# Patient Record
Sex: Female | Born: 1980 | Race: White | Hispanic: No | Marital: Single | Smoking: Current every day smoker
Health system: Southern US, Community
[De-identification: ages and names within clinical notes are randomized; demographics above are authoritative.]

## PROBLEM LIST (undated history)

## (undated) DIAGNOSIS — Z789 Other specified health status: Secondary | ICD-10-CM

## (undated) DIAGNOSIS — B999 Unspecified infectious disease: Secondary | ICD-10-CM

## (undated) DIAGNOSIS — B009 Herpesviral infection, unspecified: Secondary | ICD-10-CM

## (undated) HISTORY — PX: NO PAST SURGERIES: SHX2092

---

## 2010-05-24 NOTE — L&D Delivery Note (Signed)
Delivery Note At 8:56 PM a viable female was delivered via Vaginal, Spontaneous Delivery (Presentation: Vtx, OA ).  Placenta status: delivered spontaneously, intact w/cord traction .  Cord:  3VC .    Anesthesia: Epidural  Episiotomy: None Lacerations: None  Est. Blood Loss (mL): 300  Mom to postpartum.  Baby to nursery-stable.  JACKSON-MOORE,Lynea Rollison A 01/10/2011, 9:08 PM

## 2010-06-30 ENCOUNTER — Emergency Department (HOSPITAL_COMMUNITY)
Admission: EM | Admit: 2010-06-30 | Discharge: 2010-06-30 | Disposition: A | Discharge status: Home / Self Care | Payer: Medicaid Other | Attending: Emergency Medicine | Admitting: Emergency Medicine

## 2010-06-30 ENCOUNTER — Emergency Department (HOSPITAL_COMMUNITY): Payer: Medicaid Other

## 2010-06-30 ENCOUNTER — Emergency Department (HOSPITAL_COMMUNITY): Payer: Self-pay

## 2010-06-30 DIAGNOSIS — O210 Mild hyperemesis gravidarum: Secondary | ICD-10-CM | POA: Insufficient documentation

## 2010-06-30 LAB — POCT I-STAT, CHEM 8
BUN: 5 mg/dL — ABNORMAL LOW (ref 6–23)
Calcium, Ion: 1.16 mmol/L (ref 1.12–1.32)
Chloride: 103 mEq/L (ref 96–112)
Creatinine, Ser: 0.8 mg/dL (ref 0.4–1.2)
Glucose, Bld: 80 mg/dL (ref 70–99)
HCT: 41 % (ref 36.0–46.0)
Hemoglobin: 13.9 g/dL (ref 12.0–15.0)
Potassium: 3.9 meq/L (ref 3.5–5.1)
Sodium: 137 meq/L (ref 135–145)
TCO2: 21 mmol/L (ref 0–100)

## 2010-06-30 LAB — URINE MICROSCOPIC-ADD ON

## 2010-06-30 LAB — POCT PREGNANCY, URINE: Preg Test, Ur: POSITIVE

## 2010-06-30 LAB — URINALYSIS, ROUTINE W REFLEX MICROSCOPIC
Bilirubin Urine: NEGATIVE
Hgb urine dipstick: NEGATIVE
Ketones, ur: NEGATIVE mg/dL
Nitrite: NEGATIVE
Protein, ur: NEGATIVE mg/dL
Specific Gravity, Urine: 1.025 (ref 1.005–1.030)
Urine Glucose, Fasting: NEGATIVE mg/dL
Urobilinogen, UA: 1 mg/dL (ref 0.0–1.0)
pH: 6 (ref 5.0–8.0)

## 2010-08-10 ENCOUNTER — Inpatient Hospital Stay (HOSPITAL_COMMUNITY)
Admission: AD | Admit: 2010-08-10 | Discharge: 2010-08-10 | Disposition: A | Discharge status: Home / Self Care | Payer: Medicaid Other | Source: Ambulatory Visit | Attending: Obstetrics and Gynecology | Admitting: Obstetrics and Gynecology

## 2010-08-10 DIAGNOSIS — N12 Tubulo-interstitial nephritis, not specified as acute or chronic: Secondary | ICD-10-CM | POA: Insufficient documentation

## 2010-08-10 DIAGNOSIS — O2390 Unspecified genitourinary tract infection in pregnancy, unspecified trimester: Secondary | ICD-10-CM

## 2010-08-10 DIAGNOSIS — M549 Dorsalgia, unspecified: Secondary | ICD-10-CM | POA: Insufficient documentation

## 2010-08-10 LAB — URINE MICROSCOPIC-ADD ON

## 2010-08-10 LAB — CBC
Platelets: 241 10*3/uL (ref 150–400)
RBC: 4.36 MIL/uL (ref 3.87–5.11)
WBC: 13.3 10*3/uL — ABNORMAL HIGH (ref 4.0–10.5)

## 2010-08-10 LAB — URINALYSIS, ROUTINE W REFLEX MICROSCOPIC
Bilirubin Urine: NEGATIVE
Glucose, UA: NEGATIVE mg/dL
Specific Gravity, Urine: 1.02 (ref 1.005–1.030)
pH: 6 (ref 5.0–8.0)

## 2010-08-12 LAB — URINE CULTURE: Culture  Setup Time: 201203200117

## 2010-09-17 ENCOUNTER — Other Ambulatory Visit (HOSPITAL_COMMUNITY): Payer: Self-pay | Admitting: Obstetrics

## 2010-09-17 DIAGNOSIS — O2690 Pregnancy related conditions, unspecified, unspecified trimester: Secondary | ICD-10-CM

## 2010-09-17 DIAGNOSIS — Z0489 Encounter for examination and observation for other specified reasons: Secondary | ICD-10-CM

## 2010-09-22 ENCOUNTER — Ambulatory Visit (HOSPITAL_COMMUNITY)
Admission: RE | Admit: 2010-09-22 | Discharge: 2010-09-22 | Disposition: A | Discharge status: Home / Self Care | Payer: Medicaid Other | Source: Ambulatory Visit | Attending: Obstetrics | Admitting: Obstetrics

## 2010-09-22 DIAGNOSIS — O352XX0 Maternal care for (suspected) hereditary disease in fetus, not applicable or unspecified: Secondary | ICD-10-CM | POA: Insufficient documentation

## 2010-09-22 DIAGNOSIS — Z363 Encounter for antenatal screening for malformations: Secondary | ICD-10-CM | POA: Insufficient documentation

## 2010-09-22 DIAGNOSIS — O358XX0 Maternal care for other (suspected) fetal abnormality and damage, not applicable or unspecified: Secondary | ICD-10-CM | POA: Insufficient documentation

## 2010-09-22 DIAGNOSIS — O2690 Pregnancy related conditions, unspecified, unspecified trimester: Secondary | ICD-10-CM

## 2010-09-22 DIAGNOSIS — O99330 Smoking (tobacco) complicating pregnancy, unspecified trimester: Secondary | ICD-10-CM | POA: Insufficient documentation

## 2010-09-22 DIAGNOSIS — Z0489 Encounter for examination and observation for other specified reasons: Secondary | ICD-10-CM

## 2010-09-22 DIAGNOSIS — Z1389 Encounter for screening for other disorder: Secondary | ICD-10-CM | POA: Insufficient documentation

## 2010-10-05 ENCOUNTER — Other Ambulatory Visit (HOSPITAL_COMMUNITY): Payer: Self-pay | Admitting: Obstetrics

## 2010-10-05 DIAGNOSIS — O99340 Other mental disorders complicating pregnancy, unspecified trimester: Secondary | ICD-10-CM

## 2010-10-05 DIAGNOSIS — O352XX0 Maternal care for (suspected) hereditary disease in fetus, not applicable or unspecified: Secondary | ICD-10-CM

## 2010-10-09 LAB — GC/CHLAMYDIA PROBE AMP, GENITAL: Gonorrhea: NEGATIVE

## 2010-10-09 LAB — ABO/RH: RH Type: POSITIVE

## 2010-10-09 LAB — RUBELLA ANTIBODY, IGM: Rubella: IMMUNE

## 2010-11-16 ENCOUNTER — Inpatient Hospital Stay (HOSPITAL_COMMUNITY)
Admission: AD | Admit: 2010-11-16 | Discharge: 2010-11-16 | Disposition: A | Discharge status: Home / Self Care | Payer: Medicaid Other | Source: Ambulatory Visit | Attending: Obstetrics | Admitting: Obstetrics

## 2010-11-16 DIAGNOSIS — K219 Gastro-esophageal reflux disease without esophagitis: Secondary | ICD-10-CM

## 2010-11-16 DIAGNOSIS — R109 Unspecified abdominal pain: Secondary | ICD-10-CM | POA: Insufficient documentation

## 2010-11-16 DIAGNOSIS — O9989 Other specified diseases and conditions complicating pregnancy, childbirth and the puerperium: Secondary | ICD-10-CM

## 2010-11-16 DIAGNOSIS — O2390 Unspecified genitourinary tract infection in pregnancy, unspecified trimester: Secondary | ICD-10-CM | POA: Insufficient documentation

## 2010-11-16 DIAGNOSIS — O99891 Other specified diseases and conditions complicating pregnancy: Secondary | ICD-10-CM

## 2010-11-16 DIAGNOSIS — N390 Urinary tract infection, site not specified: Secondary | ICD-10-CM | POA: Insufficient documentation

## 2010-11-16 LAB — URINALYSIS, ROUTINE W REFLEX MICROSCOPIC
Bilirubin Urine: NEGATIVE
Glucose, UA: NEGATIVE mg/dL
Ketones, ur: NEGATIVE mg/dL
Specific Gravity, Urine: 1.015 (ref 1.005–1.030)
pH: 7.5 (ref 5.0–8.0)

## 2010-11-16 LAB — URINE MICROSCOPIC-ADD ON

## 2010-11-17 ENCOUNTER — Encounter (HOSPITAL_COMMUNITY): Payer: Self-pay

## 2010-11-17 ENCOUNTER — Ambulatory Visit (HOSPITAL_COMMUNITY)
Admission: RE | Admit: 2010-11-17 | Discharge: 2010-11-17 | Disposition: A | Discharge status: Home / Self Care | Payer: Medicaid Other | Source: Ambulatory Visit | Attending: Obstetrics | Admitting: Obstetrics

## 2010-11-17 DIAGNOSIS — O352XX0 Maternal care for (suspected) hereditary disease in fetus, not applicable or unspecified: Secondary | ICD-10-CM | POA: Insufficient documentation

## 2010-11-17 DIAGNOSIS — O99340 Other mental disorders complicating pregnancy, unspecified trimester: Secondary | ICD-10-CM

## 2010-11-17 DIAGNOSIS — O99330 Smoking (tobacco) complicating pregnancy, unspecified trimester: Secondary | ICD-10-CM | POA: Insufficient documentation

## 2010-11-20 LAB — URINE CULTURE

## 2010-12-18 ENCOUNTER — Encounter (HOSPITAL_COMMUNITY): Payer: Self-pay | Admitting: *Deleted

## 2010-12-18 ENCOUNTER — Inpatient Hospital Stay (HOSPITAL_COMMUNITY)
Admission: AD | Admit: 2010-12-18 | Discharge: 2010-12-18 | Disposition: A | Discharge status: Home / Self Care | Payer: Medicaid Other | Source: Ambulatory Visit | Attending: Obstetrics | Admitting: Obstetrics

## 2010-12-18 DIAGNOSIS — O99330 Smoking (tobacco) complicating pregnancy, unspecified trimester: Secondary | ICD-10-CM | POA: Insufficient documentation

## 2010-12-18 DIAGNOSIS — O9989 Other specified diseases and conditions complicating pregnancy, childbirth and the puerperium: Secondary | ICD-10-CM | POA: Insufficient documentation

## 2010-12-18 DIAGNOSIS — R109 Unspecified abdominal pain: Secondary | ICD-10-CM | POA: Insufficient documentation

## 2010-12-18 DIAGNOSIS — O26899 Other specified pregnancy related conditions, unspecified trimester: Secondary | ICD-10-CM

## 2010-12-18 DIAGNOSIS — N949 Unspecified condition associated with female genital organs and menstrual cycle: Secondary | ICD-10-CM | POA: Insufficient documentation

## 2010-12-18 HISTORY — DX: Herpesviral infection, unspecified: B00.9

## 2010-12-18 LAB — URINALYSIS, ROUTINE W REFLEX MICROSCOPIC
Bilirubin Urine: NEGATIVE
Nitrite: NEGATIVE
Specific Gravity, Urine: 1.01 (ref 1.005–1.030)
Urobilinogen, UA: 0.2 mg/dL (ref 0.0–1.0)
pH: 7 (ref 5.0–8.0)

## 2010-12-18 LAB — URINE MICROSCOPIC-ADD ON

## 2010-12-18 MED ORDER — TERBUTALINE SULFATE 1 MG/ML IJ SOLN
0.2500 mg | Freq: Once | INTRAMUSCULAR | Status: AC
  Administered 2010-12-18: 0.25 mg via SUBCUTANEOUS
  Filled 2010-12-18: qty 1

## 2010-12-18 NOTE — ED Provider Notes (Signed)
History   Pt presents today c/o vaginal pain since yesterday. She is 34.3wks. Her last episode of intercourse was yesterday. She denies vag bleeding, dc, fever, or any other problems at this time.  Chief Complaint  Patient presents with  . Pelvic Pain   HPI  OB History    Grav Para Term Preterm Abortions TAB SAB Ect Mult Living   6 5 1 4  0 0 0 0 0 4      Past Medical History  Diagnosis Date  . Herpes     History reviewed. No pertinent past surgical history.  Family History  Problem Relation Age of Onset  . Diabetes Mother   . Asthma Mother     History  Substance Use Topics  . Smoking status: Current Everyday Smoker -- 0.5 packs/day for 9 years    Types: Cigarettes  . Smokeless tobacco: Never Used  . Alcohol Use: No    Allergies: No Known Allergies  Prescriptions prior to admission  Medication Sig Dispense Refill  . calcium carbonate (TUMS - DOSED IN MG ELEMENTAL CALCIUM) 500 MG chewable tablet Chew 2 tablets by mouth once.        . prenatal vitamin w/FE, FA (PRENATAL 1 + 1) 27-1 MG TABS Take 1 tablet by mouth daily.          Review of Systems  Constitutional: Negative for fever.  Cardiovascular: Negative for chest pain and palpitations.  Gastrointestinal: Positive for abdominal pain. Negative for nausea, vomiting, diarrhea and constipation.  Genitourinary: Negative for dysuria, urgency, frequency, hematuria and flank pain.  Neurological: Negative for dizziness and headaches.  Psychiatric/Behavioral: Negative for depression and suicidal ideas.   Physical Exam   Blood pressure 110/62, pulse 88, temperature 98.3 F (36.8 C), temperature source Oral, resp. rate 20, height 5\' 5"  (1.651 m), weight 154 lb (69.854 kg).  Physical Exam  Constitutional: She is oriented to person, place, and time. She appears well-developed and well-nourished. No distress.  HENT:  Head: Normocephalic and atraumatic.  Eyes: EOM are normal. Pupils are equal, round, and reactive to  light.  GI: Soft. She exhibits no distension. There is no tenderness. There is no rebound and no guarding.  Genitourinary: No tenderness or bleeding around the vagina. No vaginal discharge found.       Cervix FT/thick/high.  Neurological: She is alert and oriented to person, place, and time.  Skin: Skin is warm and dry. She is not diaphoretic.  Psychiatric: She has a normal mood and affect. Her behavior is normal. Judgment and thought content normal.    MAU Course  Procedures  NST reactive. Ctx every 2-4 min that have subsided since pt given terbutaline. Cervix remained unchanged over an hour.  Assessment and Plan  Preg: discussed signs and sx of preterm labor. Reminded of [facility]. She has a f/u appt scheduled with Dr. Gaynell Face on Monday. Discussed diet, activity, risks, and precautions.  Clinton Gallant. Tadeusz Stahl III, DrHSc, MPAS, PA-C  12/18/2010, 11:09 AM   Henrietta Hoover, PA 12/18/10 1115

## 2010-12-18 NOTE — Progress Notes (Signed)
Pain in crotch all night, unable to sleep

## 2010-12-23 LAB — STREP B DNA PROBE: GBS: NEGATIVE

## 2011-01-09 ENCOUNTER — Encounter (HOSPITAL_COMMUNITY): Payer: Self-pay

## 2011-01-09 ENCOUNTER — Inpatient Hospital Stay (HOSPITAL_COMMUNITY)
Admission: AD | Admit: 2011-01-09 | Discharge: 2011-01-09 | Disposition: A | Discharge status: Home / Self Care | Payer: Medicaid Other | Source: Ambulatory Visit | Attending: Obstetrics | Admitting: Obstetrics

## 2011-01-09 DIAGNOSIS — O479 False labor, unspecified: Secondary | ICD-10-CM | POA: Insufficient documentation

## 2011-01-09 NOTE — Progress Notes (Signed)
Contractions since last night patient appears very uncomfortable brought directly to room 4 in MAU

## 2011-01-09 NOTE — ED Notes (Signed)
Called Dr. Tamela Oddi on call for Dr. Gaynell Face, patient is G6P5 (1 preterm delivery 32 weeks baby died), contractions every 2 to 3 minutes, cervix 2/80/-1 bloody show, fhr reassuring, GBS negative, patient uncomfortable, received order to recheck cervix in one hour.

## 2011-01-10 ENCOUNTER — Inpatient Hospital Stay (HOSPITAL_COMMUNITY)
Admission: AD | Admit: 2011-01-10 | Discharge: 2011-01-12 | DRG: 775 | Disposition: A | Discharge status: Home / Self Care | Payer: Medicaid Other | Source: Ambulatory Visit | Attending: Obstetrics | Admitting: Obstetrics

## 2011-01-10 ENCOUNTER — Inpatient Hospital Stay (HOSPITAL_COMMUNITY)
Admission: AD | Admit: 2011-01-10 | Discharge: 2011-01-10 | Disposition: A | Discharge status: Home / Self Care | Payer: Medicaid Other | Source: Ambulatory Visit | Attending: Obstetrics | Admitting: Obstetrics

## 2011-01-10 ENCOUNTER — Encounter (HOSPITAL_COMMUNITY): Payer: Self-pay | Admitting: Anesthesiology

## 2011-01-10 ENCOUNTER — Encounter (HOSPITAL_COMMUNITY): Payer: Self-pay | Admitting: *Deleted

## 2011-01-10 ENCOUNTER — Encounter (HOSPITAL_COMMUNITY): Payer: Self-pay | Admitting: Obstetrics and Gynecology

## 2011-01-10 ENCOUNTER — Inpatient Hospital Stay (HOSPITAL_COMMUNITY): Payer: Medicaid Other | Admitting: Anesthesiology

## 2011-01-10 DIAGNOSIS — B999 Unspecified infectious disease: Secondary | ICD-10-CM | POA: Insufficient documentation

## 2011-01-10 DIAGNOSIS — IMO0001 Reserved for inherently not codable concepts without codable children: Secondary | ICD-10-CM

## 2011-01-10 DIAGNOSIS — O479 False labor, unspecified: Secondary | ICD-10-CM | POA: Insufficient documentation

## 2011-01-10 HISTORY — DX: Unspecified infectious disease: B99.9

## 2011-01-10 LAB — CBC
HCT: 36.6 % (ref 36.0–46.0)
Hemoglobin: 12.5 g/dL (ref 12.0–15.0)
MCHC: 34.2 g/dL (ref 30.0–36.0)
RBC: 4.28 MIL/uL (ref 3.87–5.11)

## 2011-01-10 LAB — RPR: RPR Ser Ql: NONREACTIVE

## 2011-01-10 MED ORDER — PHENYLEPHRINE 40 MCG/ML (10ML) SYRINGE FOR IV PUSH (FOR BLOOD PRESSURE SUPPORT)
PREFILLED_SYRINGE | INTRAVENOUS | Status: AC
  Filled 2011-01-10: qty 5

## 2011-01-10 MED ORDER — OXYTOCIN 20 UNITS IN LACTATED RINGERS INFUSION - SIMPLE: 125.0000 mL/h | INTRAVENOUS | Status: DC

## 2011-01-10 MED ORDER — PHENYLEPHRINE 40 MCG/ML (10ML) SYRINGE FOR IV PUSH (FOR BLOOD PRESSURE SUPPORT): 80.0000 ug | PREFILLED_SYRINGE | INTRAVENOUS | Status: DC | PRN

## 2011-01-10 MED ORDER — LIDOCAINE HCL (PF) 1 % IJ SOLN: 30.0000 mL | INTRAMUSCULAR | Status: DC | PRN

## 2011-01-10 MED ORDER — LACTATED RINGERS IV SOLN
INTRAVENOUS | Status: DC
  Administered 2011-01-10: 17:00:00 via INTRAVENOUS

## 2011-01-10 MED ORDER — LIDOCAINE HCL 1.5 % IJ SOLN
INTRAMUSCULAR | Status: DC | PRN
  Administered 2011-01-10 (×2): 5 mL via EPIDURAL

## 2011-01-10 MED ORDER — CITRIC ACID-SODIUM CITRATE 334-500 MG/5ML PO SOLN: 30.0000 mL | ORAL | Status: DC | PRN

## 2011-01-10 MED ORDER — EPHEDRINE 5 MG/ML INJ
INTRAVENOUS | Status: AC
  Filled 2011-01-10: qty 4

## 2011-01-10 MED ORDER — MISOPROSTOL 25 MCG QUARTER TABLET: 75.0000 ug | ORAL_TABLET | ORAL | Status: DC

## 2011-01-10 MED ORDER — EPHEDRINE 5 MG/ML INJ: 10.0000 mg | INTRAVENOUS | Status: DC | PRN

## 2011-01-10 MED ORDER — LACTATED RINGERS IV SOLN: 500.0000 mL | INTRAVENOUS | Status: DC | PRN

## 2011-01-10 MED ORDER — OXYTOCIN BOLUS FROM INFUSION
500.0000 mL | Freq: Once | INTRAVENOUS | Status: DC
  Filled 2011-01-10: qty 500

## 2011-01-10 MED ORDER — DIPHENHYDRAMINE HCL 50 MG/ML IJ SOLN: 12.5000 mg | INTRAMUSCULAR | Status: DC | PRN

## 2011-01-10 MED ORDER — FENTANYL 2.5 MCG/ML BUPIVACAINE 1/10 % EPIDURAL INFUSION (WH - ANES)
INTRAMUSCULAR | Status: AC
  Filled 2011-01-10: qty 60

## 2011-01-10 MED ORDER — FENTANYL 2.5 MCG/ML BUPIVACAINE 1/10 % EPIDURAL INFUSION (WH - ANES)
INTRAMUSCULAR | Status: DC | PRN
  Administered 2011-01-10: 14 mL/h via EPIDURAL

## 2011-01-10 MED ORDER — ZOLPIDEM TARTRATE 10 MG PO TABS
10.0000 mg | ORAL_TABLET | Freq: Once | ORAL | Status: AC
  Administered 2011-01-10: 10 mg via ORAL
  Filled 2011-01-10: qty 1

## 2011-01-10 MED ORDER — LACTATED RINGERS IV SOLN: 500.0000 mL | Freq: Once | INTRAVENOUS | Status: DC

## 2011-01-10 MED ORDER — FLEET ENEMA 7-19 GM/118ML RE ENEM: 1.0000 | ENEMA | RECTAL | Status: DC | PRN

## 2011-01-10 MED ORDER — FENTANYL 2.5 MCG/ML BUPIVACAINE 1/10 % EPIDURAL INFUSION (WH - ANES): 14.0000 mL/h | INTRAMUSCULAR | Status: DC

## 2011-01-10 MED ORDER — OXYTOCIN 20 UNITS IN LACTATED RINGERS INFUSION - SIMPLE
1.0000 m[IU]/min | INTRAVENOUS | Status: DC
  Filled 2011-01-10: qty 1000

## 2011-01-10 MED ORDER — OXYTOCIN 20 UNITS IN LACTATED RINGERS INFUSION - SIMPLE
125.0000 mL/h | INTRAVENOUS | Status: DC | PRN
  Administered 2011-01-10: 999 mL/h via INTRAVENOUS

## 2011-01-10 MED ORDER — OXYCODONE-ACETAMINOPHEN 5-325 MG PO TABS: 1.0000 | ORAL_TABLET | ORAL | Status: DC | PRN

## 2011-01-10 MED ORDER — TERBUTALINE SULFATE 1 MG/ML IJ SOLN: 0.2500 mg | Freq: Once | INTRAMUSCULAR | Status: AC | PRN

## 2011-01-10 NOTE — Progress Notes (Signed)
Pt brought to MAU room 10 for eval

## 2011-01-10 NOTE — Progress Notes (Signed)
"  I was seen here this morning and they sent me home.  I have been hurting all day, but I tried to ride it out.  About 0000 this morning, the UC's got even worse and closer together, so I had to come back."

## 2011-01-10 NOTE — Anesthesia Preprocedure Evaluation (Signed)
Anesthesia Evaluation  Name, MR# and DOB Patient awake  General Assessment Comment  Reviewed: Allergy & Precautions, H&P , NPO status , Patient's Chart, lab work & pertinent test results  Airway Mallampati: II TM Distance: >3 FB Neck ROM: full    Dental No notable dental hx. (+) Teeth Intact   Pulmonary  clear to auscultation  pulmonary exam normalPulmonary Exam Normal breath sounds clear to auscultation none    Cardiovascular     Neuro/Psych Negative Neurological ROS  Negative Psych ROS  GI/Hepatic/Renal negative GI ROS, negative Liver ROS, and negative Renal ROS (+)       Endo/Other  Negative Endocrine ROS (+)      Abdominal Normal abdominal exam  (+)   Musculoskeletal negative musculoskeletal ROS (+)   Hematology negative hematology ROS (+)   Peds  Reproductive/Obstetrics (+) Pregnancy    Anesthesia Other Findings             Anesthesia Physical Anesthesia Plan  ASA: II  Anesthesia Plan: Epidural   Post-op Pain Management:    Induction:   Airway Management Planned:   Additional Equipment:   Intra-op Plan:   Post-operative Plan:   Informed Consent: I have reviewed the patients History and Physical, chart, labs and discussed the procedure including the risks, benefits and alternatives for the proposed anesthesia with the patient or authorized representative who has indicated his/her understanding and acceptance.     Plan Discussed with:   Anesthesia Plan Comments:         Anesthesia Quick Evaluation  

## 2011-01-10 NOTE — H&P (Signed)
Diana Walter is a 30 y.o. female presenting for contractions. Maternal Medical History:  Reason for admission: Reason for admission: contractions.  Reason for Admission:   nauseaContractions: Onset was 6-12 hours ago.   Frequency: regular.   Perceived severity is strong.    Fetal activity: Perceived fetal activity is normal.   Last perceived fetal movement was within the past hour.    Prenatal complications: Substance abuse.   Tobacco use    OB History    Grav Para Term Preterm Abortions TAB SAB Ect Mult Living   6 5 1 4  0 0 0 0 0 4     Past Medical History  Diagnosis Date  . Herpes   . Infection     Hx of HSV   History reviewed. No pertinent past surgical history. Family History: family history includes Asthma in her mother and Diabetes in her mother. Social History:  reports that she has been smoking Cigarettes.  She has a 4.5 pack-year smoking history. She has never used smokeless tobacco. She reports that she does not drink alcohol or use illicit drugs.  Review of Systems  Constitutional: Negative for fever.  Eyes: Negative for blurred vision.  Respiratory: Negative for shortness of breath.   Gastrointestinal: Negative for nausea and vomiting.  Skin: Negative for rash.  Neurological: Negative for headaches.    Dilation: 4.5 Effacement (%): 100 Station: -2 Exam by:: jackson-moore Blood pressure 101/63, pulse 148, temperature 97.9 F (36.6 C), temperature source Oral, resp. rate 18. Maternal Exam:  Uterine Assessment: Contraction frequency is regular.   Abdomen: Patient reports no abdominal tenderness. Fetal presentation: vertex  Introitus: not evaluated.     Fetal Exam Fetal Monitor Review: Variability: moderate (6-25 bpm).   Pattern: accelerations present and no decelerations.    Fetal State Assessment: Category I - tracings are normal.     Physical Exam  Constitutional: She appears well-developed.  HENT:  Head: Normocephalic.  Neck: Neck supple.  No thyromegaly present.  Cardiovascular: Normal rate and regular rhythm.   Respiratory: Breath sounds normal.  GI: Soft. Bowel sounds are normal.  Skin: No rash noted.    Prenatal labs: ABO, Rh: A/Positive/-- (05/18 0000) Antibody: Negative (05/18 0000) Rubella:   RPR: Nonreactive (05/18 0000)  HBsAg: Negative (05/18 0000)  HIV: Non-reactive (05/18 0000)  GBS: Negative (08/01 0000)   Assessment/Plan: 30 y.o. with an IUP @ [redacted]w[redacted]d.  Latent labor.  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  Admit Epidural Possible augmentation of labor   Anticipate an NSVD JACKSON-MOORE,Rizwan Kuyper A 01/10/2011, 7:26 PM

## 2011-01-10 NOTE — Anesthesia Procedure Notes (Signed)
Epidural Patient location during procedure: OB Start time: 01/10/2011 4:57 PM End time: 01/10/2011 5:06 PM Reason for block: procedure for pain  Staffing Anesthesiologist: Sandrea Hughs Performed by: anesthesiologist   Preanesthetic Checklist Completed: patient identified, site marked, surgical consent, pre-op evaluation, timeout performed, IV checked, risks and benefits discussed and monitors and equipment checked  Epidural Patient position: sitting Prep: site prepped and draped and DuraPrep Patient monitoring: continuous pulse ox and blood pressure Approach: midline Injection technique: LOR air  Needle:  Needle type: Tuohy  Needle gauge: 17 G Needle length: 9 cm Needle insertion depth: 5 cm cm Catheter type: closed end flexible Catheter size: 19 Gauge Catheter at skin depth: 10 cm Test dose: negative and 1.5% lidocaine  Assessment Sensory level: T8 Events: blood not aspirated, injection not painful, no injection resistance, negative IV test and no paresthesia

## 2011-01-10 NOTE — Progress Notes (Signed)
Pt very uncomfortable taken from car in w/c to room. Placed pt on monitor. SVE

## 2011-01-10 NOTE — Progress Notes (Signed)
01/10/11 2000  Provider Notification  Provider Name/Title Roseanna Rainbow, MD  Method of Notification Phone  Pharmacy called to question order for cytotec PO. Called MD to verify order. Provider stated she wants pitocin to get med.  Katina Dung nurse in charge does not feel comfortable administering cytotec PO since policy stated to given PO and wants provider to administer med.. Provider stated to change order to pitocin

## 2011-01-11 ENCOUNTER — Encounter (HOSPITAL_COMMUNITY): Payer: Self-pay | Admitting: *Deleted

## 2011-01-11 LAB — CBC
HCT: 33 % — ABNORMAL LOW (ref 36.0–46.0)
Hemoglobin: 11.1 g/dL — ABNORMAL LOW (ref 12.0–15.0)
MCH: 29.1 pg (ref 26.0–34.0)
MCHC: 33.6 g/dL (ref 30.0–36.0)

## 2011-01-11 MED ORDER — MEASLES, MUMPS & RUBELLA VAC ~~LOC~~ INJ
0.5000 mL | INJECTION | Freq: Once | SUBCUTANEOUS | Status: DC
  Filled 2011-01-11: qty 0.5

## 2011-01-11 MED ORDER — PRENATAL PLUS 27-1 MG PO TABS
1.0000 | ORAL_TABLET | Freq: Every day | ORAL | Status: DC
  Administered 2011-01-11: 1 via ORAL
  Filled 2011-01-11: qty 1

## 2011-01-11 MED ORDER — ONDANSETRON HCL 4 MG PO TABS: 4.0000 mg | ORAL_TABLET | ORAL | Status: DC | PRN

## 2011-01-11 MED ORDER — DIBUCAINE 1 % RE OINT: 1.0000 "application " | TOPICAL_OINTMENT | RECTAL | Status: DC | PRN

## 2011-01-11 MED ORDER — SODIUM CHLORIDE 0.9 % IJ SOLN: 3.0000 mL | INTRAMUSCULAR | Status: DC | PRN

## 2011-01-11 MED ORDER — ONDANSETRON HCL 4 MG/2ML IJ SOLN: 4.0000 mg | INTRAMUSCULAR | Status: DC | PRN

## 2011-01-11 MED ORDER — MAGNESIUM HYDROXIDE 400 MG/5ML PO SUSP: 30.0000 mL | ORAL | Status: DC | PRN

## 2011-01-11 MED ORDER — BENZOCAINE-MENTHOL 20-0.5 % EX AERO: 1.0000 "application " | INHALATION_SPRAY | CUTANEOUS | Status: DC | PRN

## 2011-01-11 MED ORDER — SODIUM CHLORIDE 0.9 % IV SOLN: 250.0000 mL | INTRAVENOUS | Status: DC

## 2011-01-11 MED ORDER — MEDROXYPROGESTERONE ACETATE 150 MG/ML IM SUSP: 150.0000 mg | INTRAMUSCULAR | Status: DC | PRN

## 2011-01-11 MED ORDER — TETANUS-DIPHTH-ACELL PERTUSSIS 5-2.5-18.5 LF-MCG/0.5 IM SUSP
0.5000 mL | Freq: Once | INTRAMUSCULAR | Status: AC
  Administered 2011-01-11: 0.5 mL via INTRAMUSCULAR
  Filled 2011-01-11: qty 0.5

## 2011-01-11 MED ORDER — DIPHENHYDRAMINE HCL 25 MG PO CAPS: 25.0000 mg | ORAL_CAPSULE | Freq: Four times a day (QID) | ORAL | Status: DC | PRN

## 2011-01-11 MED ORDER — FERROUS SULFATE 325 (65 FE) MG PO TABS
325.0000 mg | ORAL_TABLET | Freq: Two times a day (BID) | ORAL | Status: DC
  Administered 2011-01-11 (×2): 325 mg via ORAL
  Filled 2011-01-11 (×2): qty 1

## 2011-01-11 MED ORDER — ZOLPIDEM TARTRATE 5 MG PO TABS: 5.0000 mg | ORAL_TABLET | Freq: Every evening | ORAL | Status: DC | PRN

## 2011-01-11 MED ORDER — IBUPROFEN 600 MG PO TABS
600.0000 mg | ORAL_TABLET | Freq: Four times a day (QID) | ORAL | Status: DC
  Administered 2011-01-11 – 2011-01-12 (×7): 600 mg via ORAL
  Filled 2011-01-11 (×7): qty 1

## 2011-01-11 MED ORDER — WITCH HAZEL-GLYCERIN EX PADS: 1.0000 "application " | MEDICATED_PAD | CUTANEOUS | Status: DC | PRN

## 2011-01-11 MED ORDER — LANOLIN HYDROUS EX OINT: TOPICAL_OINTMENT | CUTANEOUS | Status: DC | PRN

## 2011-01-11 MED ORDER — SODIUM CHLORIDE 0.9 % IJ SOLN: 3.0000 mL | Freq: Two times a day (BID) | INTRAMUSCULAR | Status: DC

## 2011-01-11 MED ORDER — SENNOSIDES-DOCUSATE SODIUM 8.6-50 MG PO TABS
2.0000 | ORAL_TABLET | Freq: Every day | ORAL | Status: DC
  Administered 2011-01-11: 2 via ORAL

## 2011-01-11 NOTE — Progress Notes (Signed)
UR chart review completed.  

## 2011-01-11 NOTE — Anesthesia Postprocedure Evaluation (Signed)
  Anesthesia Post-op Note  Patient: Diana Walter  Procedure(s) Performed: * No procedures listed *  Patient Location: Mother/Baby  Anesthesia Type: Epidural  Level of Consciousness: awake, alert  and oriented  Airway and Oxygen Therapy: Patient Spontanous Breathing  Post-op Pain: none  Post-op Assessment: Post-op Vital signs reviewed, Patient's Cardiovascular Status Stable, No headache, No backache, No residual numbness and No residual motor weakness  Post-op Vital Signs: Reviewed and stable  Complications: No apparent anesthesia complications

## 2011-01-11 NOTE — Progress Notes (Signed)
  Postpartum day one Vital signs normal Fundus firm Lochia moderate Legs negative No complaints doing well

## 2011-01-11 NOTE — Anesthesia Postprocedure Evaluation (Signed)
Anesthesia Post Note  Patient: Diana Walter  Procedure(s) Performed: * No procedures listed *  Anesthesia type: Epidural  Patient location: Mother/Baby  Post pain: Pain level controlled  Post assessment: Post-op Vital signs reviewed  Last Vitals:  Filed Vitals:   01/10/11 2231  BP: 117/58  Pulse: 98  Temp:   Resp:     Post vital signs: Reviewed  Level of consciousness: awake  Complications: No apparent anesthesia complications

## 2011-01-11 NOTE — Addendum Note (Signed)
Addendum  created 01/11/11 0933 by Madison Hickman   Modules edited:Charges VN, Notes Section

## 2011-01-12 NOTE — Progress Notes (Signed)
  Postpartum day 2 Vital signs normal Fundus firm Lochia moderate Legs negative No complaints home today

## 2011-01-12 NOTE — Discharge Summary (Signed)
Obstetric Discharge Summary Reason for Admission: onset of labor Prenatal Procedures: none Intrapartum Procedures: spontaneous vaginal delivery Postpartum Procedures: none Complications-Operative and Postpartum: none Hemoglobin  Date Value Range Status  01/11/2011 11.1* 12.0-15.0 (g/dL) Final     HCT  Date Value Range Status  01/11/2011 33.0* 36.0-46.0 (%) Final    Discharge Diagnoses: Term Pregnancy-delivered  Discharge Information: Date: 01/12/2011 Activity: pelvic rest Diet: routine Medications: Tylenol #3 Condition: stable Instructions: refer to practice specific booklet Discharge to: home Follow-up Information    Follow up with [name], MD. Call in 6 weeks.   Contact information:   [address] [place] [address] [phone]          Newborn Data: Live born female  Birth Weight: 6 lb 10.2 oz (3011 g) APGAR: 9, 9  Home with mother.  Diana Walter 01/12/2011, 6:27 AM

## 2011-04-12 ENCOUNTER — Other Ambulatory Visit: Payer: Self-pay | Admitting: Obstetrics

## 2011-04-12 ENCOUNTER — Encounter (HOSPITAL_COMMUNITY)
Admission: RE | Admit: 2011-04-12 | Discharge: 2011-04-12 | Disposition: A | Discharge status: Home / Self Care | Payer: Medicaid Other | Source: Ambulatory Visit | Attending: Obstetrics | Admitting: Obstetrics

## 2011-04-12 ENCOUNTER — Encounter (HOSPITAL_COMMUNITY): Payer: Self-pay

## 2011-04-12 HISTORY — DX: Other specified health status: Z78.9

## 2011-04-12 LAB — CBC
HCT: 43 % (ref 36.0–46.0)
MCH: 29.6 pg (ref 26.0–34.0)
MCV: 90.1 fL (ref 78.0–100.0)
Platelets: 313 10*3/uL (ref 150–400)
RDW: 14.6 % (ref 11.5–15.5)

## 2011-04-12 NOTE — Patient Instructions (Addendum)
YOUR PROCEDURE IS SCHEDULED ON:04/22/11  ENTER THROUGH THE MAIN ENTRANCE OF [facility] AT:7:45am Thursday  USE DESK PHONE AND DIAL [phone] TO INFORM us OF YOUR ARRIVAL  CALL [phone] IF YOU HAVE ANY QUESTIONS OR PROBLEMS PRIOR TO YOUR ARRIVAL.  REMEMBER: DO NOT EAT OR DRINK AFTER MIDNIGHT :Wed.  SPECIAL INSTRUCTIONS:none   YOU MAY BRUSH YOUR TEETH THE MORNING OF SURGERY   TAKE THESE MEDICINES THE DAY OF SURGERY WITH SIP OF WATER:none   DO NOT WEAR JEWELRY, EYE MAKEUP, LIPSTICK OR DARK FINGERNAIL POLISH DO NOT WEAR LOTIONS OR DEODORANT DO NOT SHAVE FOR 48 HOURS PRIOR TO SURGERY  YOU WILL NOT BE ALLOWED TO DRIVE YOURSELF HOME.  NAME OF DRIVER: TK- [phone]

## 2011-04-19 NOTE — H&P (Signed)
NAMEEVANGELENE, VORA                 ACCOUNT NO.:  [id number]  MEDICAL RECORD NO.:  [id number]  LOCATION:  [facility]                         FACILITY:  [facility]  PHYSICIAN:  Kathreen Cosier, M.D.DATE OF BIRTH:  1981-05-02  DATE OF ADMISSION:  04/12/2011 DATE OF DISCHARGE:                             HISTORY & PHYSICAL   HISTORY OF PRESENT ILLNESS:  The patient is a 30 year old, gravida 6, para 5-1-0-5, who desires tubal ligation.  The patient understands procedure, it can fail, resulting in pregnancy in tube or uterus.  PAST MEDICAL HISTORY:  She has a history of herpes.  PAST SURGICAL HISTORY:  Negative.  SOCIAL HISTORY:  Negative.  SYSTEM REVIEW:  Negative.  PHYSICAL EXAMINATION:  GENERAL:  This is a well-developed female, in no distress. HEENT:  Negative. LUNGS:  Clear. HEART:  Regular rhythm.  No murmurs, no gallops. BREASTS:  No masses. ABDOMEN:  Not distended.  No hepatosplenomegaly.  Nontender. PELVIC:  Uterus normal.  Negative adnexa.  Vagina, external genitalia normal. EXTREMITIES:  Negative.          ______________________________ Kathreen Cosier, M.D.     BAM/MEDQ  D:  04/19/2011  T:  04/19/2011  Job:  [id number]

## 2011-04-22 ENCOUNTER — Encounter (HOSPITAL_COMMUNITY): Payer: Self-pay | Admitting: Anesthesiology

## 2011-04-22 ENCOUNTER — Ambulatory Visit (HOSPITAL_COMMUNITY): Payer: Medicaid Other | Admitting: Anesthesiology

## 2011-04-22 ENCOUNTER — Ambulatory Visit (HOSPITAL_COMMUNITY)
Admission: RE | Admit: 2011-04-22 | Discharge: 2011-04-22 | Disposition: A | Discharge status: Home / Self Care | Payer: Medicaid Other | Source: Ambulatory Visit | Attending: Obstetrics | Admitting: Obstetrics

## 2011-04-22 ENCOUNTER — Encounter (HOSPITAL_COMMUNITY)
Admission: RE | Disposition: A | Discharge status: Home / Self Care | Payer: Self-pay | Source: Ambulatory Visit | Attending: Obstetrics

## 2011-04-22 ENCOUNTER — Encounter (HOSPITAL_COMMUNITY): Payer: Self-pay | Admitting: *Deleted

## 2011-04-22 DIAGNOSIS — IMO0001 Reserved for inherently not codable concepts without codable children: Secondary | ICD-10-CM

## 2011-04-22 DIAGNOSIS — B999 Unspecified infectious disease: Secondary | ICD-10-CM

## 2011-04-22 DIAGNOSIS — Z01812 Encounter for preprocedural laboratory examination: Secondary | ICD-10-CM | POA: Insufficient documentation

## 2011-04-22 DIAGNOSIS — Z302 Encounter for sterilization: Secondary | ICD-10-CM | POA: Insufficient documentation

## 2011-04-22 HISTORY — PX: LAPAROSCOPIC TUBAL LIGATION: SHX1937

## 2011-04-22 SURGERY — LIGATION, FALLOPIAN TUBE, LAPAROSCOPIC
Anesthesia: General | Site: Abdomen | Laterality: Bilateral | Wound class: Clean Contaminated

## 2011-04-22 MED ORDER — ACETAMINOPHEN-CODEINE #3 300-30 MG PO TABS
1.0000 | ORAL_TABLET | ORAL | Status: AC
  Administered 2011-04-22: 1 via ORAL

## 2011-04-22 MED ORDER — FENTANYL CITRATE 0.05 MG/ML IJ SOLN
INTRAMUSCULAR | Status: AC
  Filled 2011-04-22: qty 5

## 2011-04-22 MED ORDER — ROCURONIUM BROMIDE 100 MG/10ML IV SOLN
INTRAVENOUS | Status: DC | PRN
  Administered 2011-04-22: 25 mg via INTRAVENOUS
  Administered 2011-04-22: 5 mg via INTRAVENOUS

## 2011-04-22 MED ORDER — MIDAZOLAM HCL 5 MG/5ML IJ SOLN
INTRAMUSCULAR | Status: DC | PRN
  Administered 2011-04-22: 2 mg via INTRAVENOUS

## 2011-04-22 MED ORDER — ACETAMINOPHEN-CODEINE #3 300-30 MG PO TABS
ORAL_TABLET | ORAL | Status: AC
  Administered 2011-04-22: 1 via ORAL
  Filled 2011-04-22: qty 1

## 2011-04-22 MED ORDER — GLYCOPYRROLATE 0.2 MG/ML IJ SOLN
INTRAMUSCULAR | Status: DC | PRN
  Administered 2011-04-22: .6 mg via INTRAVENOUS

## 2011-04-22 MED ORDER — NEOSTIGMINE METHYLSULFATE 1 MG/ML IJ SOLN
INTRAMUSCULAR | Status: DC | PRN
  Administered 2011-04-22: 3 mg via INTRAVENOUS

## 2011-04-22 MED ORDER — GLYCOPYRROLATE 0.2 MG/ML IJ SOLN
INTRAMUSCULAR | Status: AC
  Filled 2011-04-22: qty 1

## 2011-04-22 MED ORDER — FENTANYL CITRATE 0.05 MG/ML IJ SOLN
INTRAMUSCULAR | Status: DC | PRN
  Administered 2011-04-22: 100 ug via INTRAVENOUS
  Administered 2011-04-22 (×3): 50 ug via INTRAVENOUS

## 2011-04-22 MED ORDER — KETOROLAC TROMETHAMINE 30 MG/ML IJ SOLN
INTRAMUSCULAR | Status: DC | PRN
  Administered 2011-04-22: 30 mg via INTRAMUSCULAR

## 2011-04-22 MED ORDER — ONDANSETRON HCL 4 MG/2ML IJ SOLN: 4.0000 mg | Freq: Once | INTRAMUSCULAR | Status: DC | PRN

## 2011-04-22 MED ORDER — MIDAZOLAM HCL 2 MG/2ML IJ SOLN
INTRAMUSCULAR | Status: AC
  Filled 2011-04-22: qty 2

## 2011-04-22 MED ORDER — MEPERIDINE HCL 25 MG/ML IJ SOLN: 6.2500 mg | INTRAMUSCULAR | Status: DC | PRN

## 2011-04-22 MED ORDER — PROPOFOL 10 MG/ML IV EMUL
INTRAVENOUS | Status: DC | PRN
  Administered 2011-04-22: 150 mg via INTRAVENOUS

## 2011-04-22 MED ORDER — KETOROLAC TROMETHAMINE 30 MG/ML IJ SOLN: 15.0000 mg | Freq: Once | INTRAMUSCULAR | Status: DC | PRN

## 2011-04-22 MED ORDER — LACTATED RINGERS IV SOLN
INTRAVENOUS | Status: DC
  Administered 2011-04-22 (×4): via INTRAVENOUS

## 2011-04-22 MED ORDER — FENTANYL CITRATE 0.05 MG/ML IJ SOLN: 25.0000 ug | INTRAMUSCULAR | Status: DC | PRN

## 2011-04-22 MED ORDER — LIDOCAINE HCL (CARDIAC) 20 MG/ML IV SOLN
INTRAVENOUS | Status: DC | PRN
  Administered 2011-04-22: 80 mg via INTRAVENOUS

## 2011-04-22 MED ORDER — DEXAMETHASONE SODIUM PHOSPHATE 4 MG/ML IJ SOLN
INTRAMUSCULAR | Status: DC | PRN
  Administered 2011-04-22: 4 mg via INTRAVENOUS

## 2011-04-22 MED ORDER — ONDANSETRON HCL 4 MG/2ML IJ SOLN
INTRAMUSCULAR | Status: DC | PRN
  Administered 2011-04-22: 4 mg via INTRAVENOUS

## 2011-04-22 SURGICAL SUPPLY — 14 items
CATH ROBINSON RED A/P 16FR (CATHETERS) ×2 IMPLANT
CHLORAPREP W/TINT 26ML (MISCELLANEOUS) ×2 IMPLANT
CLOTH BEACON ORANGE TIMEOUT ST (SAFETY) ×2 IMPLANT
DERMABOND ADVANCED (GAUZE/BANDAGES/DRESSINGS) ×1
DERMABOND ADVANCED .7 DNX12 (GAUZE/BANDAGES/DRESSINGS) ×1 IMPLANT
GLOVE BIO SURGEON STRL SZ8.5 (GLOVE) ×4 IMPLANT
GOWN PREVENTION PLUS LG XLONG (DISPOSABLE) ×2 IMPLANT
GOWN PREVENTION PLUS XXLARGE (GOWN DISPOSABLE) ×2 IMPLANT
PACK LAPAROSCOPY BASIN (CUSTOM PROCEDURE TRAY) ×2 IMPLANT
SUT MON AB 4-0 PS1 27 (SUTURE) ×2 IMPLANT
SUT VIC AB 0 CT1 27 (SUTURE) ×1
SUT VIC AB 0 CT1 27XBRD ANBCTR (SUTURE) ×1 IMPLANT
TOWEL OR 17X24 6PK STRL BLUE (TOWEL DISPOSABLE) ×4 IMPLANT
WATER STERILE IRR 1000ML POUR (IV SOLUTION) ×2 IMPLANT

## 2011-04-22 NOTE — Transfer of Care (Signed)
Immediate Anesthesia Transfer of Care Note  Patient: Diana Walter  Procedure(s) Performed:  LAPAROSCOPIC TUBAL LIGATION  Patient Location: PACU  Anesthesia Type: General  Level of Consciousness: awake, alert , oriented and patient cooperative  Airway & Oxygen Therapy: Patient Spontanous Breathing and Patient connected to nasal cannula oxygen  Post-op Assessment: Report given to PACU RN and Post -op Vital signs reviewed and stable  Post vital signs: Reviewed and stable  Complications: No apparent anesthesia complications

## 2011-04-22 NOTE — Anesthesia Postprocedure Evaluation (Signed)
Anesthesia Post Note  Patient: Diana Walter  Procedure(s) Performed:  LAPAROSCOPIC TUBAL LIGATION  Anesthesia type: General  Patient location: PACU  Post pain: Pain level controlled  Post assessment: Post-op Vital signs reviewed  Last Vitals:  Filed Vitals:   04/22/11 1000  BP: 101/62  Pulse: 108  Temp: 36.4 C  Resp: 23    Post vital signs: Reviewed  Level of consciousness: sedated  Complications: No apparent anesthesia complicationsfj

## 2011-04-22 NOTE — Anesthesia Preprocedure Evaluation (Addendum)
Anesthesia Evaluation  Patient identified by MRN, date of birth, ID band Patient awake    Reviewed: Allergy & Precautions, H&P , NPO status , Patient's Chart, lab work & pertinent test results  Airway Mallampati: I TM Distance: >3 FB Neck ROM: full    Dental  (+) Teeth Intact and Poor Dentition,    Pulmonary neg pulmonary ROS,    Pulmonary exam normal       Cardiovascular neg cardio ROS     Neuro/Psych Negative Neurological ROS  Negative Psych ROS   GI/Hepatic negative GI ROS, Neg liver ROS,   Endo/Other  Negative Endocrine ROS  Renal/GU negative Renal ROS  Genitourinary negative   Musculoskeletal negative musculoskeletal ROS (+)   Abdominal Normal abdominal exam  (+)   Peds negative pediatric ROS (+)  Hematology negative hematology ROS (+)   Anesthesia Other Findings   Reproductive/Obstetrics negative OB ROS                           Anesthesia Physical Anesthesia Plan  ASA: II  Anesthesia Plan: General   Post-op Pain Management:    Induction: Intravenous  Airway Management Planned: Oral ETT  Additional Equipment:   Intra-op Plan:   Post-operative Plan: Extubation in OR  Informed Consent: I have reviewed the patients History and Physical, chart, labs and discussed the procedure including the risks, benefits and alternatives for the proposed anesthesia with the patient or authorized representative who has indicated his/her understanding and acceptance.   Dental Advisory Given  Plan Discussed with: CRNA  Anesthesia Plan Comments:        Anesthesia Quick Evaluation

## 2011-04-22 NOTE — Op Note (Signed)
preop diagnosis multiparity postop diagnosis the same laparoscopic tubal sterilization  lanesthesia Gen. Surgeon Dr. Francoise Ceo  procedure patient in the lithotomy position abdomen perineum and vagina prepped and draped in the bladder and did a straight catheter a speculum placed in the vagina and the cervix grasped with A tenaculum in the umbilicus a transverse incision made carried out the rectus fascia fascia clean and dressed to go close the fascia and the peritoneum opened with the Mayo scissors the sleeve of the trocar inserted intraperitoneally 3 L carbon dioxide in position compared visualizing scope was inserted. The trocar uterus tubes and ovaries normal the caudal probe inserted from [facility] right tube as well as from the cornu and cauterized the tube was cauterized a total of 4 pieces Bovie lateral from the first set a 4 the left tube was cauterized in 4 places the probe was removed the CO2 allowed to escape from the peritoneal cavity fascia closed with one stitch of 0 Vicryl and the skin closed with subcuticular stitch of 4-0 Monocryl patient tolerated the procedure well cuticle room in good condition and a dictation dictated by Dr. Francoise Ceo

## 2011-04-22 NOTE — H&P (Signed)
History has not changed since the previous dictation of the history and physical and on examination there has been no change in the patients physical

## 2011-04-22 NOTE — Preoperative (Signed)
Beta Blockers   Reason not to administer Beta Blockers:Not Applicable 

## 2011-04-26 ENCOUNTER — Encounter (HOSPITAL_COMMUNITY): Payer: Self-pay | Admitting: Obstetrics

## 2014-03-25 ENCOUNTER — Encounter (HOSPITAL_COMMUNITY): Payer: Self-pay | Admitting: Obstetrics

## 2014-11-05 ENCOUNTER — Emergency Department (HOSPITAL_COMMUNITY)
Admission: EM | Admit: 2014-11-05 | Discharge: 2014-11-06 | Disposition: A | Discharge status: Home / Self Care | Payer: Medicaid Other | Attending: Emergency Medicine | Admitting: Emergency Medicine

## 2014-11-05 DIAGNOSIS — Z8619 Personal history of other infectious and parasitic diseases: Secondary | ICD-10-CM | POA: Insufficient documentation

## 2014-11-05 DIAGNOSIS — N12 Tubulo-interstitial nephritis, not specified as acute or chronic: Secondary | ICD-10-CM | POA: Insufficient documentation

## 2014-11-05 DIAGNOSIS — Z3202 Encounter for pregnancy test, result negative: Secondary | ICD-10-CM | POA: Diagnosis not present

## 2014-11-05 DIAGNOSIS — R197 Diarrhea, unspecified: Secondary | ICD-10-CM | POA: Insufficient documentation

## 2014-11-05 DIAGNOSIS — R1012 Left upper quadrant pain: Secondary | ICD-10-CM | POA: Diagnosis present

## 2014-11-05 DIAGNOSIS — R52 Pain, unspecified: Secondary | ICD-10-CM

## 2014-11-05 DIAGNOSIS — Z720 Tobacco use: Secondary | ICD-10-CM | POA: Insufficient documentation

## 2014-11-06 ENCOUNTER — Emergency Department (HOSPITAL_COMMUNITY): Payer: Medicaid Other

## 2014-11-06 ENCOUNTER — Encounter (HOSPITAL_COMMUNITY): Payer: Self-pay | Admitting: *Deleted

## 2014-11-06 LAB — I-STAT CHEM 8, ED
BUN: 6 mg/dL (ref 6–20)
CALCIUM ION: 1.16 mmol/L (ref 1.12–1.23)
CHLORIDE: 101 mmol/L (ref 101–111)
Creatinine, Ser: 0.6 mg/dL (ref 0.44–1.00)
GLUCOSE: 133 mg/dL — AB (ref 65–99)
HCT: 55 % — ABNORMAL HIGH (ref 36.0–46.0)
Hemoglobin: 18.7 g/dL — ABNORMAL HIGH (ref 12.0–15.0)
Potassium: 3.4 mmol/L — ABNORMAL LOW (ref 3.5–5.1)
Sodium: 136 mmol/L (ref 135–145)
TCO2: 20 mmol/L (ref 0–100)

## 2014-11-06 LAB — CBC WITH DIFFERENTIAL/PLATELET
BASOS ABS: 0 10*3/uL (ref 0.0–0.1)
BASOS PCT: 0 % (ref 0–1)
Eosinophils Absolute: 0 10*3/uL (ref 0.0–0.7)
Eosinophils Relative: 0 % (ref 0–5)
HCT: 50.8 % — ABNORMAL HIGH (ref 36.0–46.0)
HEMOGLOBIN: 17 g/dL — AB (ref 12.0–15.0)
LYMPHS PCT: 8 % — AB (ref 12–46)
Lymphs Abs: 1.2 10*3/uL (ref 0.7–4.0)
MCH: 30.9 pg (ref 26.0–34.0)
MCHC: 33.5 g/dL (ref 30.0–36.0)
MCV: 92.2 fL (ref 78.0–100.0)
Monocytes Absolute: 1.1 10*3/uL — ABNORMAL HIGH (ref 0.1–1.0)
Monocytes Relative: 8 % (ref 3–12)
NEUTROS ABS: 11.6 10*3/uL — AB (ref 1.7–7.7)
NEUTROS PCT: 84 % — AB (ref 43–77)
PLATELETS: 241 10*3/uL (ref 150–400)
RBC: 5.51 MIL/uL — AB (ref 3.87–5.11)
RDW: 14.3 % (ref 11.5–15.5)
WBC: 13.8 10*3/uL — AB (ref 4.0–10.5)

## 2014-11-06 LAB — PREGNANCY, URINE: Preg Test, Ur: NEGATIVE

## 2014-11-06 LAB — URINALYSIS, ROUTINE W REFLEX MICROSCOPIC
Glucose, UA: NEGATIVE mg/dL
KETONES UR: NEGATIVE mg/dL
Nitrite: POSITIVE — AB
PH: 6 (ref 5.0–8.0)
PROTEIN: 30 mg/dL — AB
SPECIFIC GRAVITY, URINE: 1.024 (ref 1.005–1.030)
Urobilinogen, UA: 2 mg/dL — ABNORMAL HIGH (ref 0.0–1.0)

## 2014-11-06 LAB — HEPATIC FUNCTION PANEL
ALBUMIN: 3.8 g/dL (ref 3.5–5.0)
ALK PHOS: 109 U/L (ref 38–126)
ALT: 61 U/L — ABNORMAL HIGH (ref 14–54)
AST: 81 U/L — AB (ref 15–41)
BILIRUBIN TOTAL: 1.1 mg/dL (ref 0.3–1.2)
Bilirubin, Direct: 0.3 mg/dL (ref 0.1–0.5)
Indirect Bilirubin: 0.8 mg/dL (ref 0.3–0.9)
Total Protein: 7.3 g/dL (ref 6.5–8.1)

## 2014-11-06 LAB — URINE MICROSCOPIC-ADD ON

## 2014-11-06 LAB — LIPASE, BLOOD: LIPASE: 19 U/L — AB (ref 22–51)

## 2014-11-06 MED ORDER — METOCLOPRAMIDE HCL 5 MG/ML IJ SOLN
10.0000 mg | Freq: Once | INTRAMUSCULAR | Status: AC
  Administered 2014-11-06: 10 mg via INTRAVENOUS
  Filled 2014-11-06: qty 2

## 2014-11-06 MED ORDER — CEPHALEXIN 500 MG PO CAPS: 500.0000 mg | ORAL_CAPSULE | Freq: Four times a day (QID) | ORAL | Status: AC

## 2014-11-06 MED ORDER — ONDANSETRON 8 MG PO TBDP: ORAL_TABLET | ORAL | Status: AC

## 2014-11-06 MED ORDER — GI COCKTAIL ~~LOC~~
30.0000 mL | Freq: Once | ORAL | Status: AC
  Administered 2014-11-06: 30 mL via ORAL
  Filled 2014-11-06: qty 30

## 2014-11-06 MED ORDER — SODIUM CHLORIDE 0.9 % IV BOLUS (SEPSIS)
1000.0000 mL | Freq: Once | INTRAVENOUS | Status: AC
  Administered 2014-11-06: 1000 mL via INTRAVENOUS

## 2014-11-06 MED ORDER — DIPHENHYDRAMINE HCL 50 MG/ML IJ SOLN
12.5000 mg | Freq: Once | INTRAMUSCULAR | Status: AC
  Administered 2014-11-06: 12.5 mg via INTRAVENOUS
  Filled 2014-11-06: qty 1

## 2014-11-06 MED ORDER — KETOROLAC TROMETHAMINE 30 MG/ML IJ SOLN
30.0000 mg | Freq: Once | INTRAMUSCULAR | Status: AC
  Administered 2014-11-06: 30 mg via INTRAVENOUS
  Filled 2014-11-06: qty 1

## 2014-11-06 MED ORDER — MELOXICAM 7.5 MG PO TABS: 7.5000 mg | ORAL_TABLET | Freq: Every day | ORAL | Status: AC

## 2014-11-06 MED ORDER — ONDANSETRON HCL 4 MG/2ML IJ SOLN
4.0000 mg | Freq: Once | INTRAMUSCULAR | Status: AC
  Administered 2014-11-06: 4 mg via INTRAVENOUS
  Filled 2014-11-06: qty 2

## 2014-11-06 MED ORDER — DICYCLOMINE HCL 10 MG/ML IM SOLN
20.0000 mg | Freq: Once | INTRAMUSCULAR | Status: AC
  Administered 2014-11-06: 20 mg via INTRAMUSCULAR
  Filled 2014-11-06: qty 2

## 2014-11-06 MED ORDER — DEXTROSE 5 % IV SOLN
1.0000 g | Freq: Once | INTRAVENOUS | Status: AC
  Administered 2014-11-06: 1 g via INTRAVENOUS
  Filled 2014-11-06: qty 10

## 2014-11-06 MED ORDER — TRAMADOL HCL 50 MG PO TABS: 50.0000 mg | ORAL_TABLET | Freq: Four times a day (QID) | ORAL | Status: AC | PRN

## 2014-11-06 NOTE — ED Notes (Signed)
Bed: ZO10 Expected date:  Expected time:  Means of arrival:  Comments: Hold for rm7

## 2014-11-06 NOTE — ED Provider Notes (Signed)
CSN: [id number]     Arrival date & time 11/05/14  2357 History   First MD Initiated Contact with Patient 11/06/14 0034     Chief Complaint  Patient presents with  . Abdominal Pain     (Consider location/radiation/quality/duration/timing/severity/associated sxs/prior Treatment) Patient is a 34 y.o. female presenting with abdominal pain. The history is provided by the patient.  Abdominal Pain Pain location:  LUQ and L flank Pain quality: sharp   Pain radiates to:  Does not radiate Pain severity:  Severe Onset quality:  Gradual Duration:  3 days Timing:  Constant Progression:  Waxing and waning Chronicity:  New Context: not suspicious food intake   Relieved by:  Nothing Worsened by:  Nothing tried Ineffective treatments:  None tried Associated symptoms: diarrhea, nausea and vomiting   Associated symptoms: no chest pain, no cough, no dysuria, no fever, no hematuria and no shortness of breath   Risk factors: not pregnant     Past Medical History  Diagnosis Date  . Herpes   . Infection     Hx of HSV  . No pertinent past medical history    Past Surgical History  Procedure Laterality Date  . No past surgeries    . Laparoscopic tubal ligation  04/22/2011    Procedure: LAPAROSCOPIC TUBAL LIGATION;  Surgeon: Kathreen Cosier, MD;  Location: [facility];  Service: Gynecology;  Laterality: Bilateral;   Family History  Problem Relation Age of Onset  . Diabetes Mother   . Asthma Mother    History  Substance Use Topics  . Smoking status: Current Every Day Smoker -- 0.50 packs/day for 9 years    Types: Cigarettes  . Smokeless tobacco: Never Used  . Alcohol Use: Yes     Comment: socially   OB History    Gravida Para Term Preterm AB TAB SAB Ectopic Multiple Living   6 6 2 4  0 0 0 0 0 5     Review of Systems  Constitutional: Negative for fever.  Respiratory: Negative for cough and shortness of breath.   Cardiovascular: Negative for chest pain.  Gastrointestinal: Positive for  nausea, vomiting, abdominal pain and diarrhea.  Genitourinary: Negative for dysuria and hematuria.  All other systems reviewed and are negative.     Allergies  Review of patient's allergies indicates no known allergies.  Home Medications   Prior to Admission medications   Medication Sig Start Date End Date Taking? Authorizing Provider  acetaminophen (TYLENOL) 500 MG tablet Take 1,000 mg by mouth every 6 (six) hours as needed (for pain.).   Yes Historical Provider, MD   BP 114/61 mmHg  Pulse 86  Temp(Src) 98.3 F (36.8 C) (Oral)  Resp 20  SpO2 99%  LMP 10/16/2014 Physical Exam  Constitutional: She is oriented to person, place, and time. She appears well-developed and well-nourished. No distress.  HENT:  Head: Normocephalic and atraumatic.  Mouth/Throat: Oropharynx is clear and moist.  Eyes: Conjunctivae are normal. Pupils are equal, round, and reactive to light.  Neck: Normal range of motion. Neck supple.  Cardiovascular: Normal rate, regular rhythm and intact distal pulses.   Pulmonary/Chest: Effort normal and breath sounds normal. No respiratory distress. She has no wheezes. She has no rales.  Abdominal: Soft. Bowel sounds are normal. There is no tenderness. There is no rebound and no guarding.  Musculoskeletal: Normal range of motion.  Neurological: She is alert and oriented to person, place, and time.  Skin: Skin is warm and dry.  Psychiatric: She has a  normal mood and affect.    ED Course  Procedures (including critical care time) Labs Review Labs Reviewed  CBC WITH DIFFERENTIAL/PLATELET - Abnormal; Notable for the following:    WBC 13.8 (*)    RBC 5.51 (*)    Hemoglobin 17.0 (*)    HCT 50.8 (*)    Neutrophils Relative % 84 (*)    Neutro Abs 11.6 (*)    Lymphocytes Relative 8 (*)    Monocytes Absolute 1.1 (*)    All other components within normal limits  URINALYSIS, ROUTINE W REFLEX MICROSCOPIC (NOT AT [facility]) - Abnormal; Notable for the following:    Color,  Urine AMBER (*)    APPearance CLOUDY (*)    Hgb urine dipstick MODERATE (*)    Bilirubin Urine SMALL (*)    Protein, ur 30 (*)    Urobilinogen, UA 2.0 (*)    Nitrite POSITIVE (*)    Leukocytes, UA MODERATE (*)    All other components within normal limits  HEPATIC FUNCTION PANEL - Abnormal; Notable for the following:    AST 81 (*)    ALT 61 (*)    All other components within normal limits  LIPASE, BLOOD - Abnormal; Notable for the following:    Lipase 19 (*)    All other components within normal limits  URINE MICROSCOPIC-ADD ON - Abnormal; Notable for the following:    Squamous Epithelial / LPF FEW (*)    Bacteria, UA MANY (*)    All other components within normal limits  I-STAT CHEM 8, ED - Abnormal; Notable for the following:    Potassium 3.4 (*)    Glucose, Bld 133 (*)    Hemoglobin 18.7 (*)    HCT 55.0 (*)    All other components within normal limits  PREGNANCY, URINE  I-STAT CHEM 8, ED    Imaging Review No results found.   EKG Interpretation None      MDM   Final diagnoses:  Pain    Medications  metoCLOPramide (REGLAN) injection 10 mg (not administered)  diphenhydrAMINE (BENADRYL) injection 12.5 mg (not administered)  sodium chloride 0.9 % bolus 1,000 mL (0 mLs Intravenous Stopped 11/06/14 0240)  dicyclomine (BENTYL) injection 20 mg (20 mg Intramuscular Given 11/06/14 0107)  ondansetron (ZOFRAN) injection 4 mg (4 mg Intravenous Given 11/06/14 0109)  ketorolac (TORADOL) 30 MG/ML injection 30 mg (30 mg Intravenous Given 11/06/14 0319)  cefTRIAXone (ROCEPHIN) 1 g in dextrose 5 % 50 mL IVPB (0 g Intravenous Stopped 11/06/14 0348)  sodium chloride 0.9 % bolus 1,000 mL (1,000 mLs Intravenous New Bag/Given 11/06/14 0317)  gi cocktail (Maalox,Lidocaine,Donnatal) (30 mLs Oral Given 11/06/14 0316)   Pyelonephritis.   PO challenged successfully.  Will treat with PO keflex QID x 10 days, zofran and pain medication.  Recheck in 2 days with your doctor.  Return for persistent  fevers, weakness, vomiting pain or any concerns.  Will stat bland diet and probiotics.  Patient verbalizes understanding and agrees to follow up    Darol Cush, MD 11/06/14 [phone]

## 2014-11-06 NOTE — ED Notes (Signed)
Pt reports LUQ pain with n/v/d x 3 days.

## 2014-11-06 NOTE — ED Notes (Signed)
Pt gone to xray, moved pt to room 18

## 2015-10-16 IMAGING — CT CT RENAL STONE PROTOCOL
2 of 3 series · 16 of 30 positions shown, 18 images · non-contrast
Comparison: None.

CLINICAL DATA: Left lower quadrant pain and nausea, vomiting, and
diarrhea for 3 days. Elevated white cell count. Red and white cells
in the urine.

EXAM:
CT ABDOMEN AND PELVIS WITHOUT CONTRAST
TECHNIQUE: Multidetector CT imaging of the abdomen and pelvis was performed
following the standard protocol without IV contrast.

[Series 3: lung · axial · 0.68mm/px · z∈[-270,-204]mm · 13 of 16 slices shown, 15 images]
[im 2/16  soft-tissue]
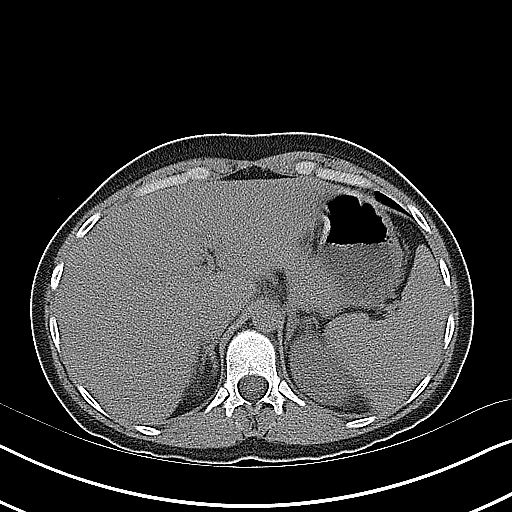
[im 2/16  bone]
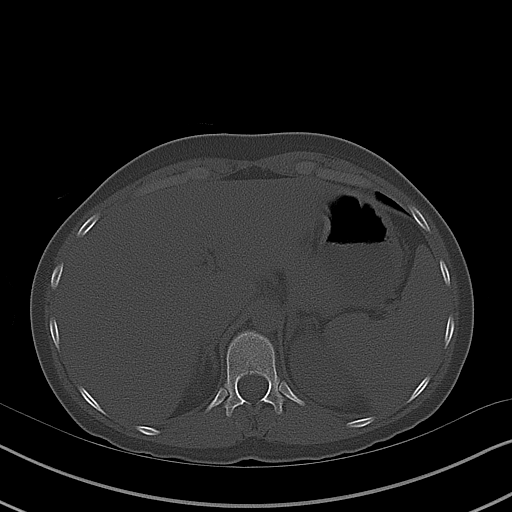
[im 3/16  soft-tissue]
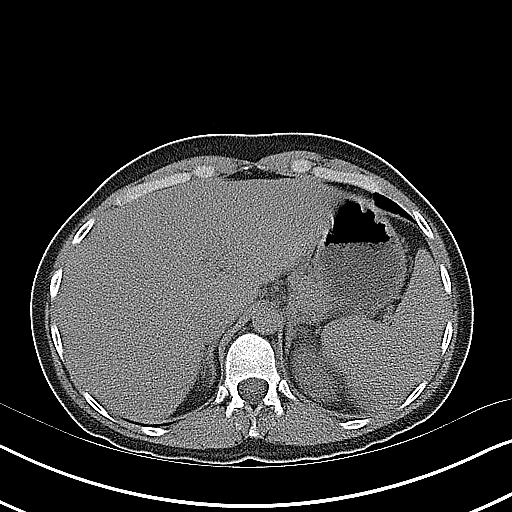
[im 4/16  soft-tissue]
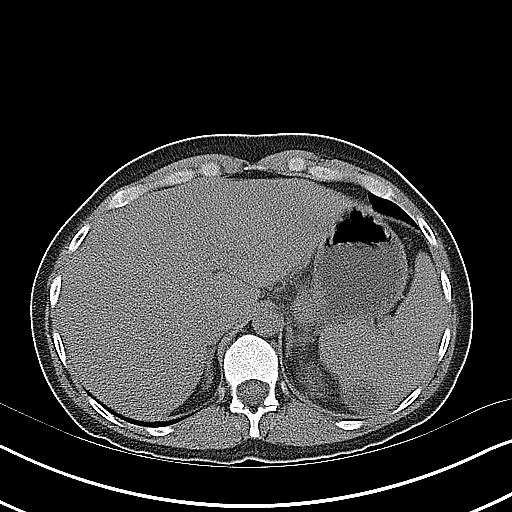
[im 5/16  soft-tissue]
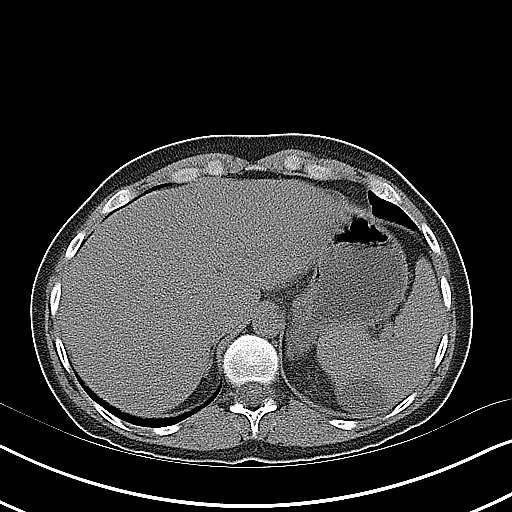
[im 6/16  soft-tissue]
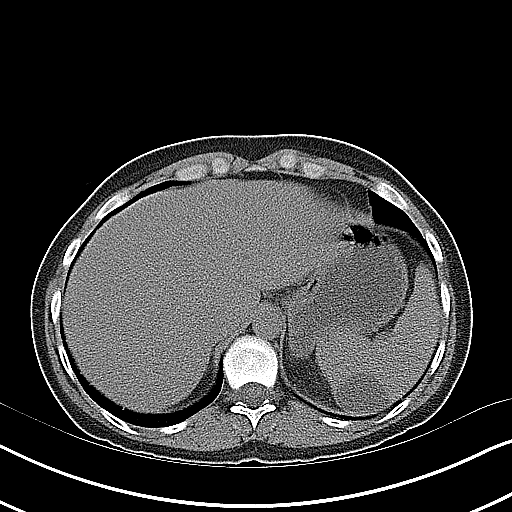
[im 7/16  soft-tissue]
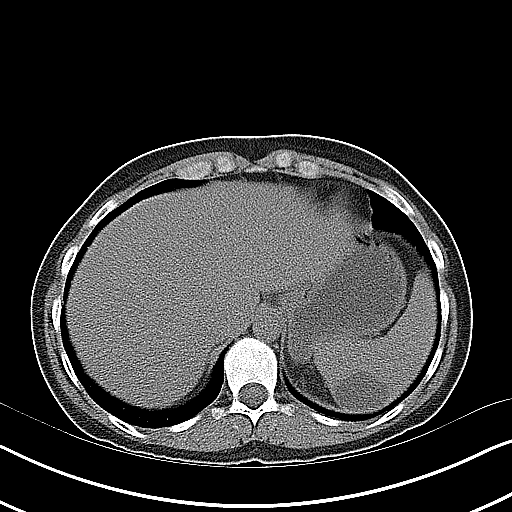
[im 9/16  soft-tissue]
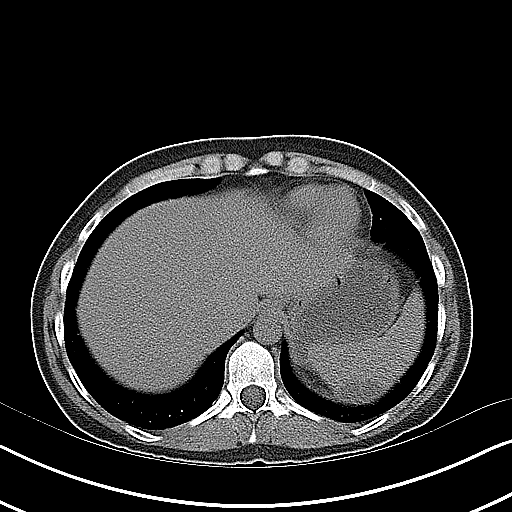
[im 10/16  soft-tissue]
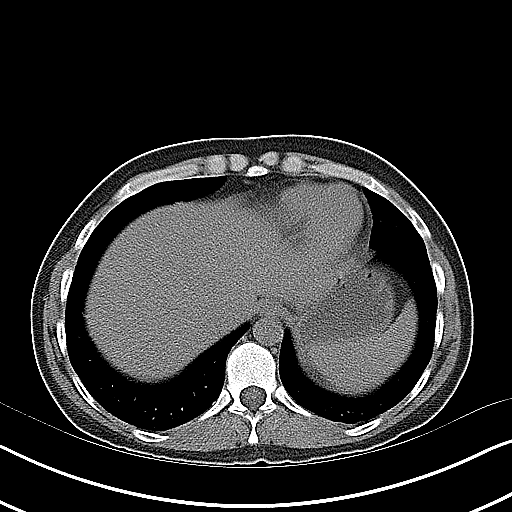
[im 11/16  soft-tissue]
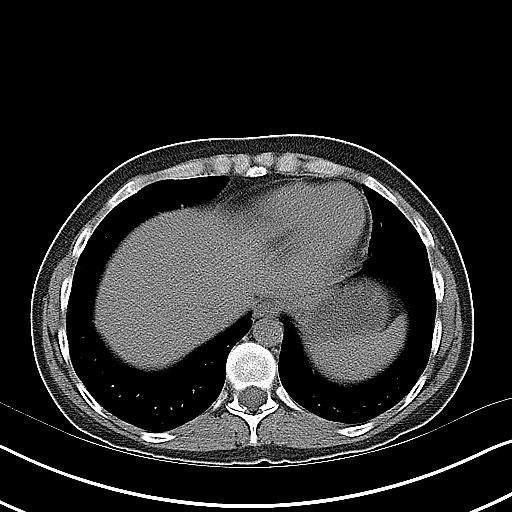
[im 11/16  bone]
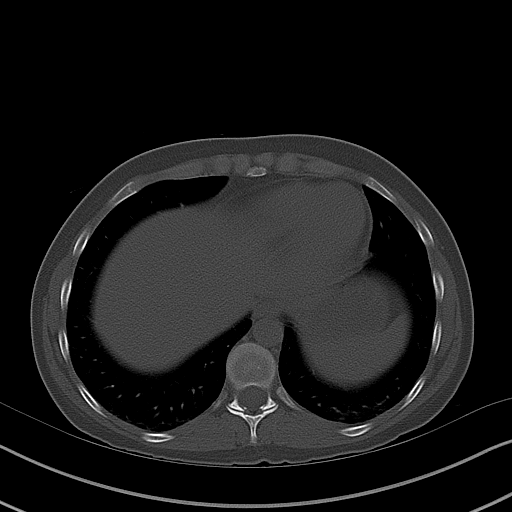
[im 12/16  soft-tissue]
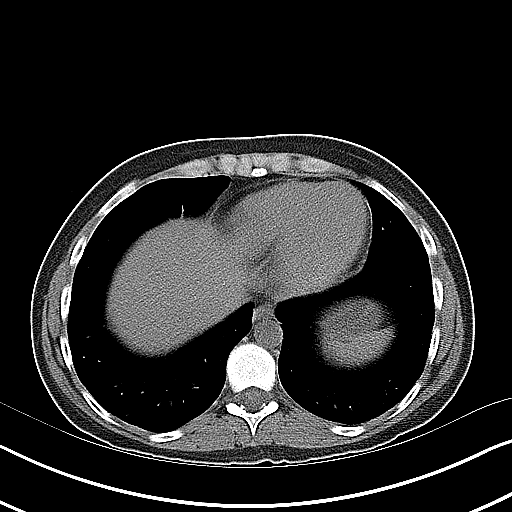
[im 13/16  soft-tissue]
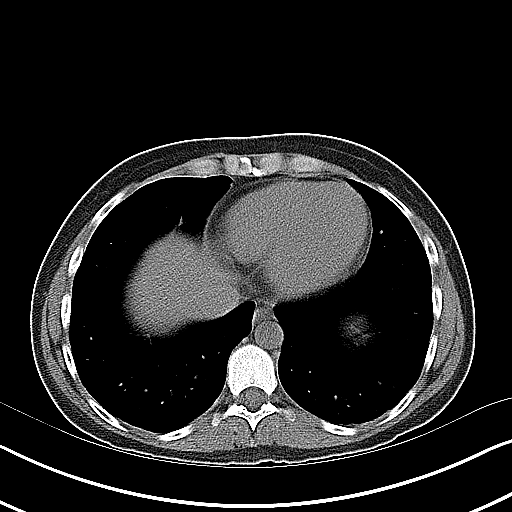
[im 14/16  soft-tissue]
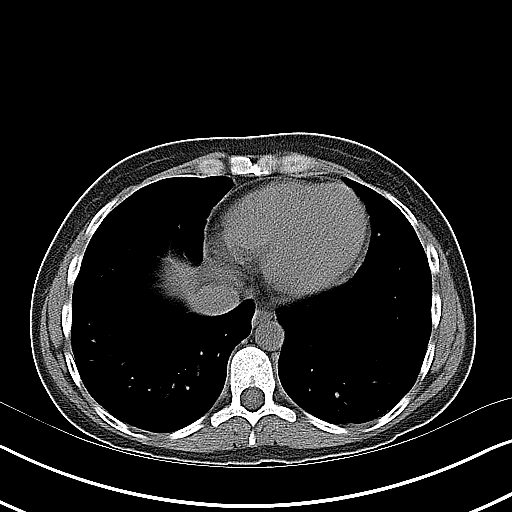
[im 15/16  soft-tissue]
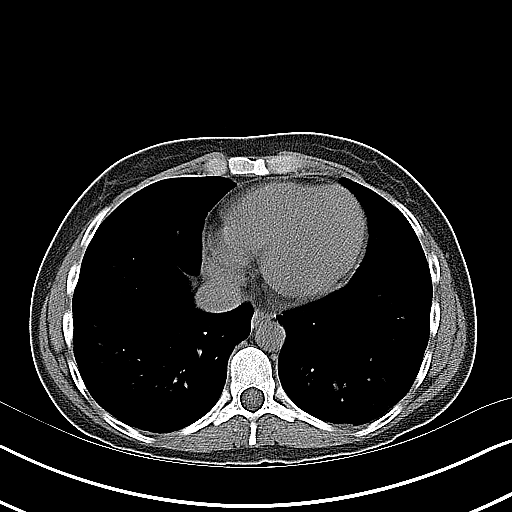

[Series 4: coronal · coronal · 0.64mm/px · 3 of 66 slices shown]
[im 22/66  soft-tissue]
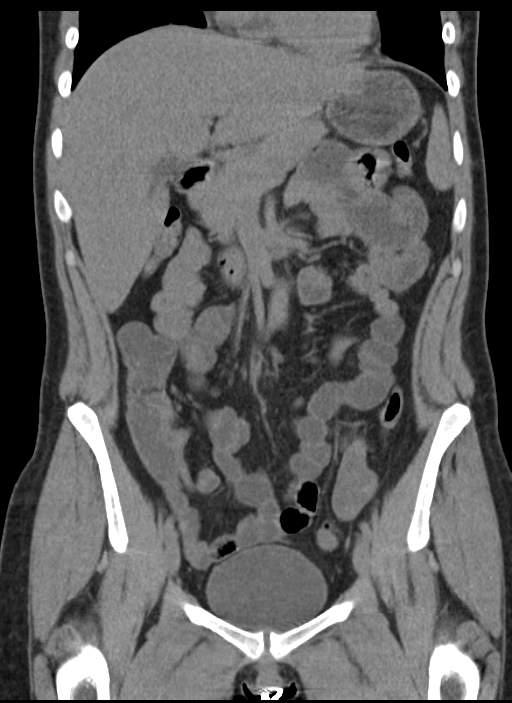
[im 29/66  soft-tissue]
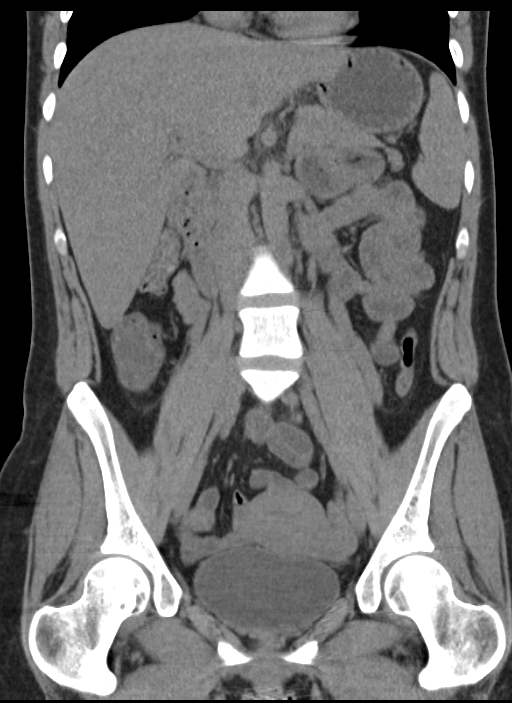
[im 37/66  soft-tissue]
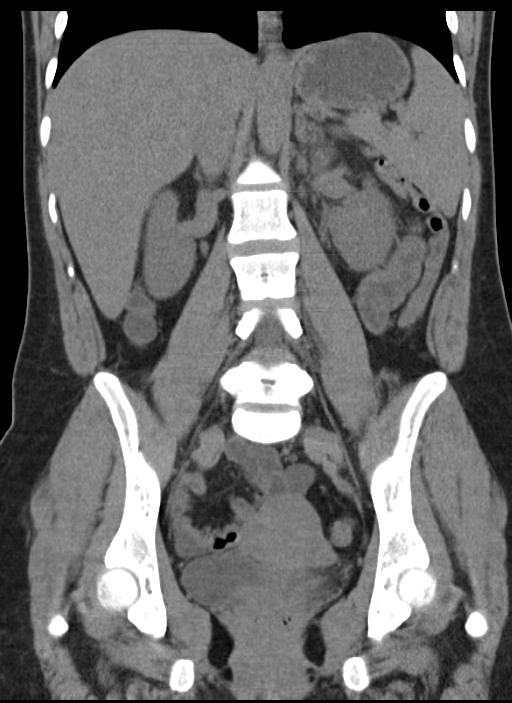

[16 of 30 positions shown; findings below may reference images not displayed]

FINDINGS: Mild dependent changes in the lung bases.

4 mm stone in the lower pole left kidney. No hydronephrosis or
hydroureter on either side. No ureteral stones. No bladder stones.
No bladder wall thickening.

There is slight infiltration in the perinephric fat on the left
which may indicate inflammatory process such as pyelonephritis.
There is a circumscribed low-attenuation lesion in the spleen
measuring 3.3 cm diameter, probably representing cyst or hemangioma.
The unenhanced appearance of the liver, gallbladder, pancreas,
adrenal glands, abdominal aorta, inferior vena cava, and
retroperitoneal lymph nodes is unremarkable. Stomach, small bowel,
and colon are not abnormally distended. No free air or free fluid in
the abdomen.

Pelvis: Uterus and ovaries are not enlarged. No free or loculated
pelvic fluid collections. No pelvic mass or lymphadenopathy.
Appendix is normal. No destructive bone lesions.
IMPRESSION: Nonobstructing stone in the lower pole left kidney. Mild perinephric
stranding on the left may indicate inflammatory process such as
pyelonephritis. Low-attenuation lesion in the spleen is probably a
cyst or hemangioma.
